# Patient Record
Sex: Female | Born: 1965 | Race: White | Hispanic: No | Marital: Married | Smoking: Current some day smoker
Health system: Southern US, Community
[De-identification: ages and names within clinical notes are randomized; demographics above are authoritative.]

## PROBLEM LIST (undated history)

## (undated) ENCOUNTER — Emergency Department: Payer: Self-pay

## (undated) DIAGNOSIS — T7840XA Allergy, unspecified, initial encounter: Secondary | ICD-10-CM

## (undated) HISTORY — DX: Allergy, unspecified, initial encounter: T78.40XA

---

## 2000-07-31 ENCOUNTER — Other Ambulatory Visit: Admission: RE | Admit: 2000-07-31 | Discharge: 2000-07-31 | Payer: Self-pay | Admitting: *Deleted

## 2004-06-23 ENCOUNTER — Other Ambulatory Visit: Admission: RE | Admit: 2004-06-23 | Discharge: 2004-06-23 | Payer: Self-pay | Admitting: Obstetrics and Gynecology

## 2005-05-05 ENCOUNTER — Other Ambulatory Visit: Admission: RE | Admit: 2005-05-05 | Discharge: 2005-05-05 | Payer: Self-pay | Admitting: Obstetrics and Gynecology

## 2006-02-21 ENCOUNTER — Encounter: Admission: RE | Admit: 2006-02-21 | Discharge: 2006-02-21 | Payer: Self-pay | Admitting: Obstetrics and Gynecology

## 2018-11-06 ENCOUNTER — Other Ambulatory Visit: Payer: Self-pay | Admitting: Obstetrics and Gynecology

## 2018-11-06 DIAGNOSIS — Z809 Family history of malignant neoplasm, unspecified: Secondary | ICD-10-CM

## 2018-11-26 ENCOUNTER — Other Ambulatory Visit: Payer: Self-pay | Admitting: Obstetrics and Gynecology

## 2018-11-27 ENCOUNTER — Ambulatory Visit
Admission: RE | Admit: 2018-11-27 | Discharge: 2018-11-27 | Disposition: A | Discharge status: Home / Self Care | Payer: Commercial Managed Care - PPO | Source: Ambulatory Visit | Attending: Obstetrics and Gynecology | Admitting: Obstetrics and Gynecology

## 2018-11-27 ENCOUNTER — Other Ambulatory Visit: Payer: Self-pay

## 2018-11-27 DIAGNOSIS — Z809 Family history of malignant neoplasm, unspecified: Secondary | ICD-10-CM

## 2018-11-27 MED ORDER — GADOBUTROL 1 MMOL/ML IV SOLN
10.0000 mL | Freq: Once | INTRAVENOUS | Status: AC | PRN
  Administered 2018-11-27: 10 mL via INTRAVENOUS

## 2018-12-14 ENCOUNTER — Other Ambulatory Visit: Payer: Self-pay

## 2018-12-14 DIAGNOSIS — Z20822 Contact with and (suspected) exposure to covid-19: Secondary | ICD-10-CM

## 2018-12-16 LAB — NOVEL CORONAVIRUS, NAA: SARS-CoV-2, NAA: NOT DETECTED

## 2020-09-05 IMAGING — MR MR BREAST BILAT WO/W CM
8 of 11 series · 33 of 48 positions shown · IV contrast (10 ml gadavist)
Comparison: Previous exam(s).

CLINICAL DATA: 53-year-old female with greater than 20% lifetime
risk of breast cancer.

LABS:  None performed today and site.
EXAM:
BILATERAL BREAST MRI WITH AND WITHOUT CONTRAST
TECHNIQUE: Multiplanar, multisequence MR images of both breasts were obtained
prior to and following the intravenous administration of 10 ml of
Gadavist.

[Series 2: t2_tirm_tra ipat (a-p) · axial · 3.0mm · 0.72mm/px · 1 of 60 slices shown]
[im 1/60]
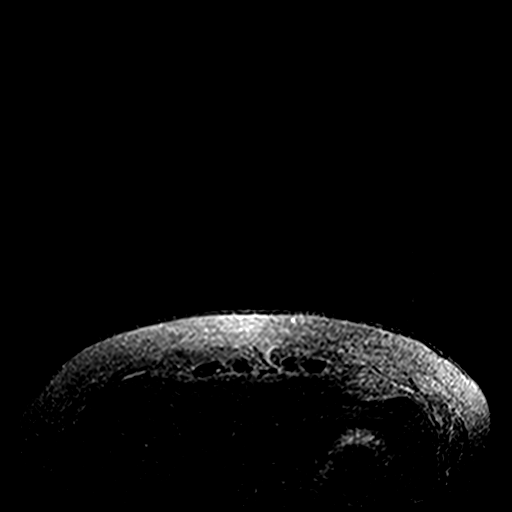

[Series 3: fl3d pre-cm no · axial · non-contrast · 1.2mm · 0.96mm/px · z∈[-79,+112]mm · 5 of 160 slices shown]
[im 1/160]
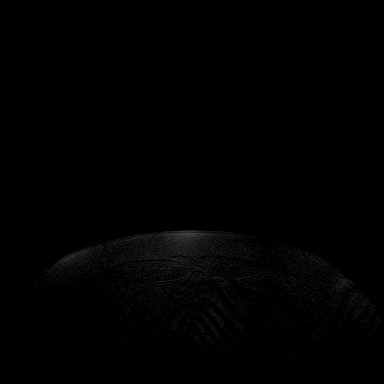
[im 40/160]
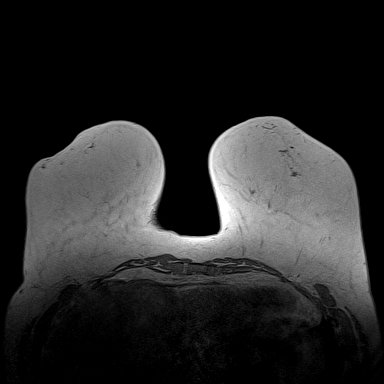
[im 80/160]
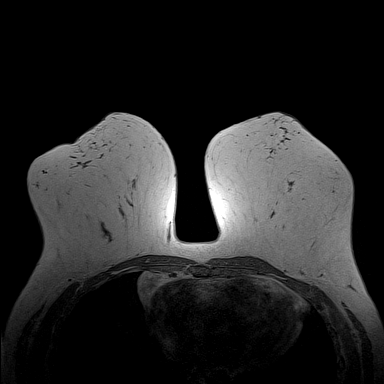
[im 120/160]
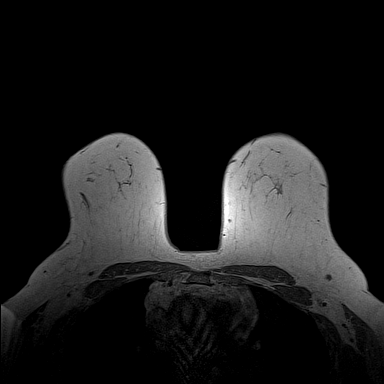
[im 160/160]
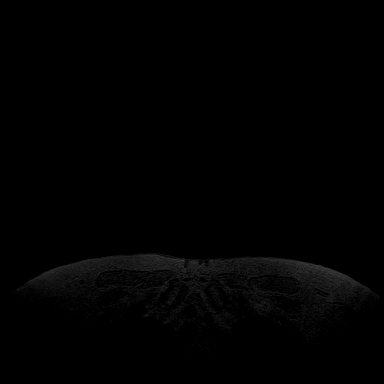

[Series 4: fl3d pre-cm · axial · non-contrast · 1.2mm · 0.96mm/px · z∈[-79,+112]mm · 5 of 160 slices shown]
[im 1/160]
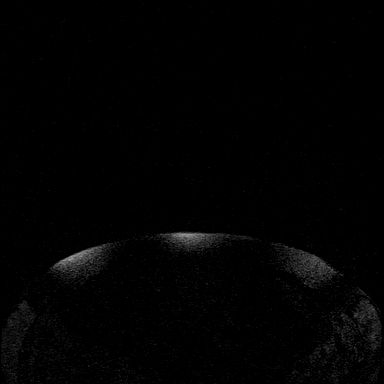
[im 40/160]
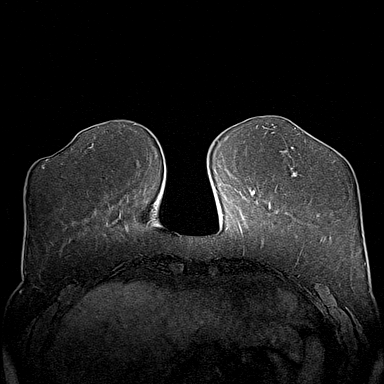
[im 80/160]
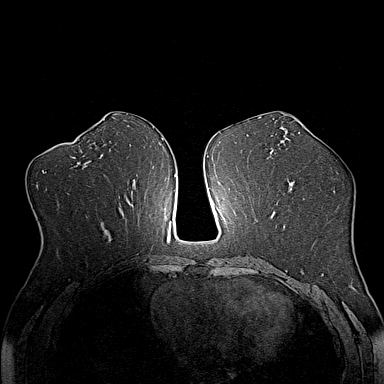
[im 120/160]
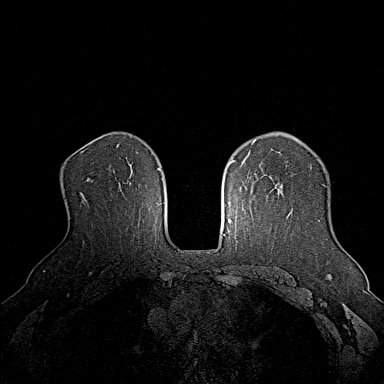
[im 160/160]
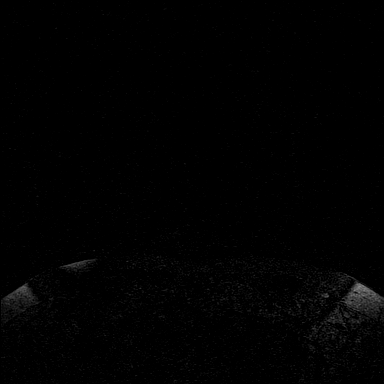

[Series 5: fl3d post-cm 20 · axial · 1.2mm · 0.96mm/px · z∈[-79,+112]mm · 5 of 160 slices shown (1 of 3)]
[im 1/160]
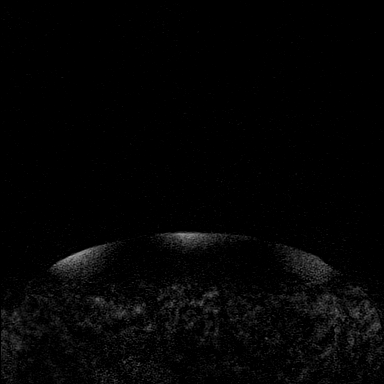
[im 40/160]
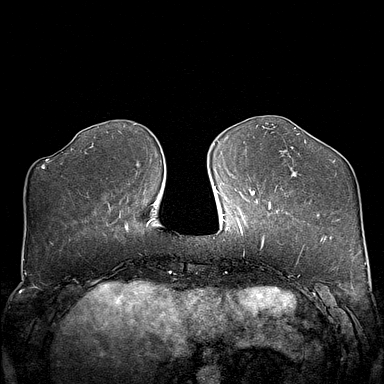
[im 80/160]
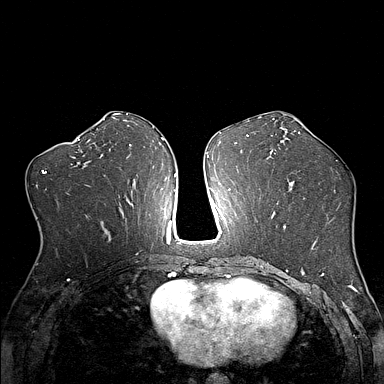
[im 120/160]
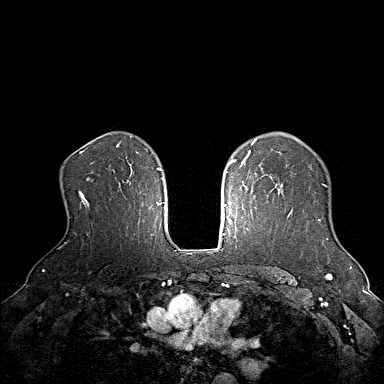
[im 160/160]
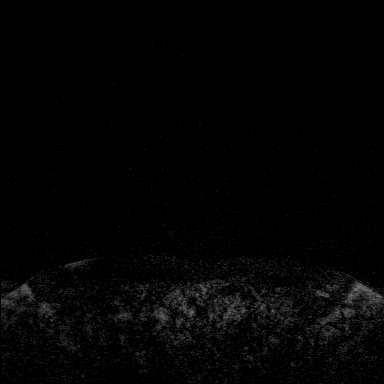

[Series 6: fl3d post-cm 20 · axial · 1.2mm · 0.96mm/px · z∈[-79,+112]mm · 6 of 160 slices shown (2 of 3)]
[im 1/160]
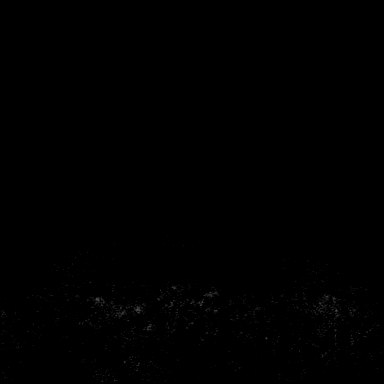
[im 32/160]
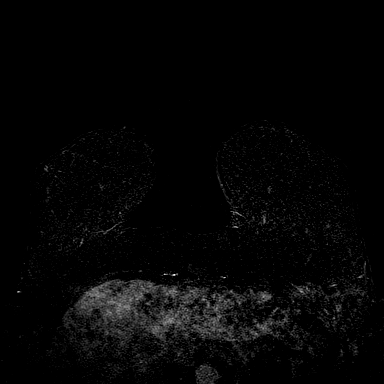
[im 64/160]
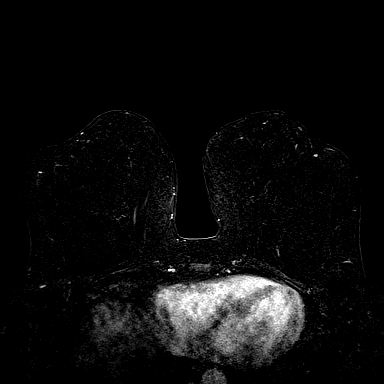
[im 96/160]
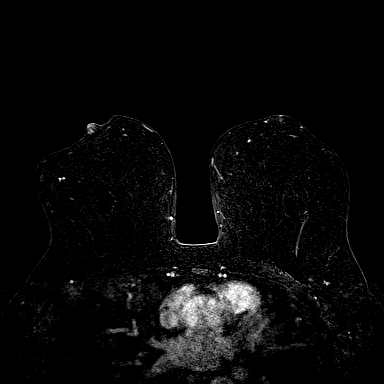
[im 128/160]
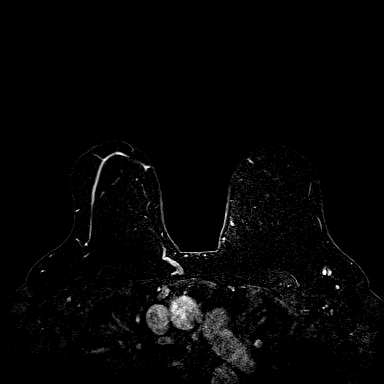
[im 160/160]
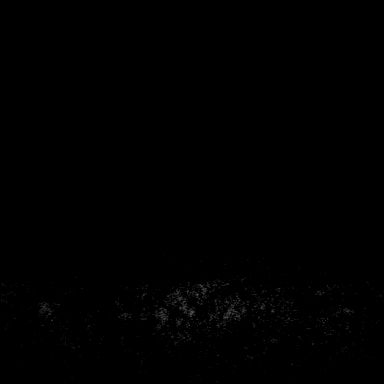

[Series 7: fl3d post-cm 20 · axial · 192.0mm · 0.96mm/px · 1 of 1 slices shown (3 of 3)]
[im 1/1]
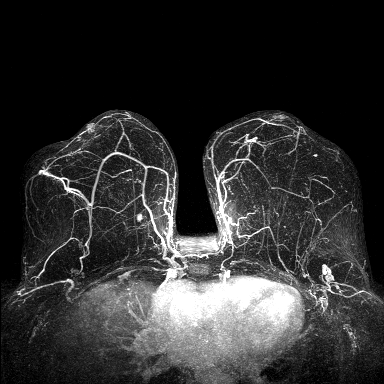

[Series 8: fl3d post-cm 3min · axial · 1.2mm · 0.96mm/px · z∈[-79,+112]mm · 6 of 160 slices shown]
[im 1/160]
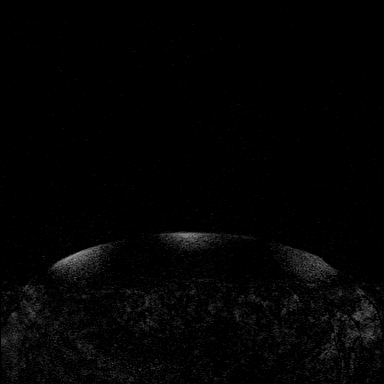
[im 32/160]
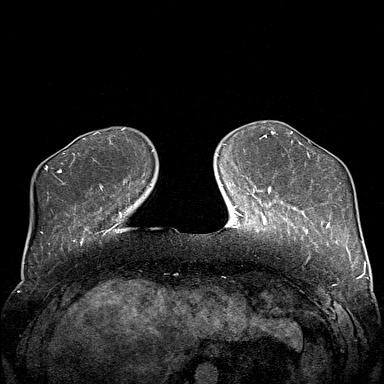
[im 64/160]
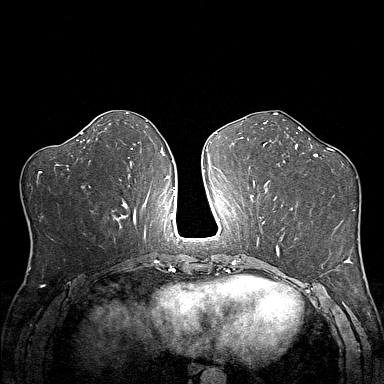
[im 96/160]
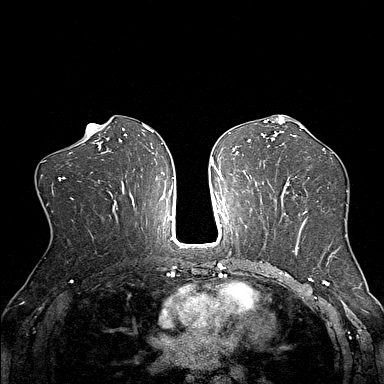
[im 128/160]
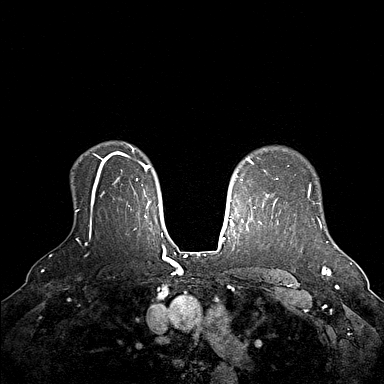
[im 160/160]
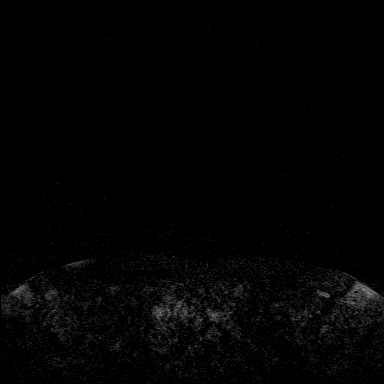

[Series 9: fl3d post-cm 3min_sub · axial · 1.2mm · 0.96mm/px · z∈[-79,+35]mm · 4 of 160 slices shown]
[im 1/160]
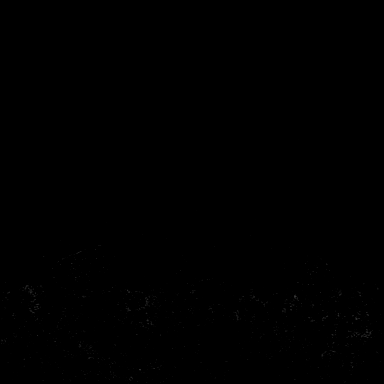
[im 32/160]
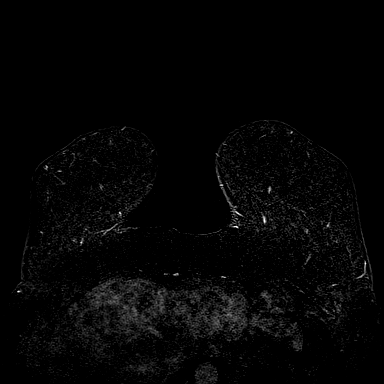
[im 64/160]
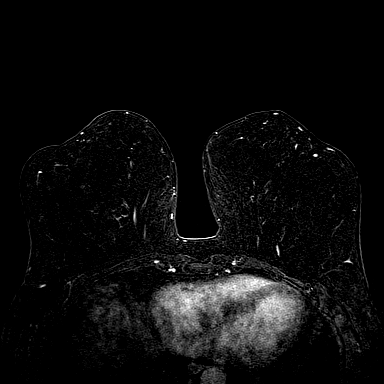
[im 96/160]
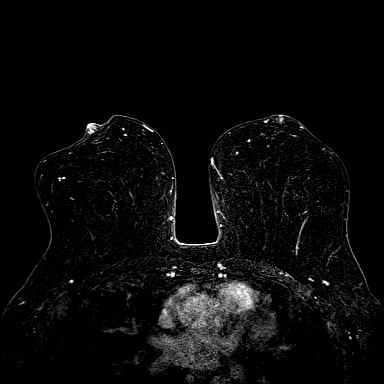

[33 of 48 positions shown; findings below may reference images not displayed]

Three-dimensional MR images were rendered by post-processing of the
original MR data on an independent workstation. The
three-dimensional MR images were interpreted, and findings are
reported in the following complete MRI report for this study. Three
dimensional images were evaluated at the independent [employer]
workstation
FINDINGS: Breast composition: b. Scattered fibroglandular tissue.

Background parenchymal enhancement: Mild.

Right breast: No suspicious mass or abnormal enhancement. A 7 mm
circumscribed mass in the far posterior upper inner aspect localizes
entirely within the skin and is consistent with a benign finding.

Left breast: No suspicious mass or abnormal enhancement.

Lymph nodes: No abnormal appearing lymph nodes.

Ancillary findings:  None.
IMPRESSION: No MRI evidence of malignancy in either breast.

RECOMMENDATION:
Annual bilateral screening with mammogram and MRI. Patient is due
for screening mammogram in August 2019.

BI-RADS CATEGORY  2: Benign.

## 2020-11-25 ENCOUNTER — Other Ambulatory Visit: Payer: Self-pay

## 2020-11-25 ENCOUNTER — Encounter: Payer: Self-pay | Admitting: Podiatry

## 2020-11-25 ENCOUNTER — Ambulatory Visit (INDEPENDENT_AMBULATORY_CARE_PROVIDER_SITE_OTHER): Payer: Commercial Managed Care - PPO | Admitting: Podiatry

## 2020-11-25 ENCOUNTER — Ambulatory Visit (INDEPENDENT_AMBULATORY_CARE_PROVIDER_SITE_OTHER): Payer: Commercial Managed Care - PPO

## 2020-11-25 DIAGNOSIS — M79672 Pain in left foot: Secondary | ICD-10-CM | POA: Diagnosis not present

## 2020-11-25 DIAGNOSIS — M79671 Pain in right foot: Secondary | ICD-10-CM

## 2020-11-25 DIAGNOSIS — M722 Plantar fascial fibromatosis: Secondary | ICD-10-CM

## 2020-11-25 DIAGNOSIS — L600 Ingrowing nail: Secondary | ICD-10-CM

## 2020-11-25 MED ORDER — TRIAMCINOLONE ACETONIDE 10 MG/ML IJ SUSP
10.0000 mg | Freq: Once | INTRAMUSCULAR | Status: AC
  Administered 2020-11-25: 10 mg

## 2020-11-25 NOTE — Patient Instructions (Signed)

## 2020-11-26 NOTE — Progress Notes (Signed)
Subjective:   Patient ID: Christy Santos, female   DOB: 55 y.o.   MRN: [id number]   HPI Patient presents stating she has been getting pain in her right heel and then her left big toe has been getting ingrown in the corner and she works on its been pretty good recently but it can become quite bothersome for her.  Patient states the heels worse when she gets up in the morning after periods of sitting and patient does not smoke likes to be active   Review of Systems  All other systems reviewed and are negative.      Objective:  Physical Exam Vitals and nursing note reviewed.  Constitutional:      Appearance: She is well-developed.  Pulmonary:     Effort: Pulmonary effort is normal.  Musculoskeletal:        General: Normal range of motion.  Skin:    General: Skin is warm.  Neurological:     Mental Status: She is alert.    Neurovascular status intact muscle strength adequate range of motion adequate with patient found to have inflammation pain of the plantar heel right at the insertional point of the tendon into the calcaneus with inflammation fluid around the medial band and pain when pressed.  On the left I noted incurvation of the medial border of the left hallux that is painful when pressed make shoe gear difficult     Assessment:  Acute plantar fasciitis right with inflammation fluid in the medial band at insertion along with ingrown toenail deformity left hallux medial border chronic in nature     Plan:  H&P reviewed both conditions.  At this point for the nail she will try soaks padding and understands ultimately correction will probably be necessary.  For the right I went ahead and I did sterile prep and injected the fascia 3 mg Kenalog 5 mg Xylocaine applied fascial brace to lift up the arch gave instructions for physical therapy support and reappoint to recheck  X-rays indicate there is moderate depression of the arch small spur no indication stress fracture right is

## 2022-02-02 ENCOUNTER — Encounter: Payer: Self-pay | Admitting: Gastroenterology

## 2022-02-25 ENCOUNTER — Ambulatory Visit (AMBULATORY_SURGERY_CENTER): Payer: Commercial Managed Care - PPO

## 2022-02-25 VITALS — Ht 67.0 in | Wt 205.0 lb

## 2022-02-25 DIAGNOSIS — Z1211 Encounter for screening for malignant neoplasm of colon: Secondary | ICD-10-CM

## 2022-02-25 MED ORDER — NA SULFATE-K SULFATE-MG SULF 17.5-3.13-1.6 GM/177ML PO SOLN: 1.0000 | Freq: Once | ORAL | 0 refills | Status: AC

## 2022-02-25 NOTE — Progress Notes (Signed)
No egg or soy allergy known to patient  No issues known to pt with past sedation with any surgeries or procedures Patient denies ever being told they had issues or difficulty with intubation  No FH of Malignant Hyperthermia Pt is not on diet pills Pt is not on  home 02  Pt is not on blood thinners  Pt denies issues with constipation  No A fib or A flutter Have any cardiac testing pending--no Pt instructed to use Single[employer]com or GoodRx for a price reduction on prep   

## 2022-03-14 ENCOUNTER — Encounter: Payer: Self-pay | Admitting: Gastroenterology

## 2022-03-23 ENCOUNTER — Encounter: Payer: Self-pay | Admitting: Certified Registered Nurse Anesthetist

## 2022-03-24 ENCOUNTER — Ambulatory Visit (AMBULATORY_SURGERY_CENTER): Payer: Commercial Managed Care - PPO | Admitting: Gastroenterology

## 2022-03-24 ENCOUNTER — Encounter: Payer: Self-pay | Admitting: Gastroenterology

## 2022-03-24 VITALS — BP 133/88 | HR 60 | Temp 97.8°F | Resp 15 | Ht 67.0 in | Wt 205.0 lb

## 2022-03-24 DIAGNOSIS — Z1211 Encounter for screening for malignant neoplasm of colon: Secondary | ICD-10-CM | POA: Diagnosis not present

## 2022-03-24 DIAGNOSIS — D120 Benign neoplasm of cecum: Secondary | ICD-10-CM

## 2022-03-24 DIAGNOSIS — D122 Benign neoplasm of ascending colon: Secondary | ICD-10-CM | POA: Diagnosis not present

## 2022-03-24 DIAGNOSIS — K635 Polyp of colon: Secondary | ICD-10-CM | POA: Diagnosis not present

## 2022-03-24 DIAGNOSIS — D123 Benign neoplasm of transverse colon: Secondary | ICD-10-CM | POA: Diagnosis not present

## 2022-03-24 MED ORDER — SODIUM CHLORIDE 0.9 % IV SOLN: 500.0000 mL | INTRAVENOUS | Status: DC

## 2022-03-24 NOTE — Progress Notes (Signed)
Pt's states no medical or surgical changes since previsit or office visit. 

## 2022-03-24 NOTE — Patient Instructions (Signed)
Please read handouts provided. Continue present medications.  Await pathology results. Repeat colonoscopy in 3 years for screening based on pathology.    YOU HAD AN ENDOSCOPIC PROCEDURE TODAY AT [facility]:   Refer to the procedure report that was given to you for any specific questions about what was found during the examination.  If the procedure report does not answer your questions, please call your gastroenterologist to clarify.  If you requested that your care partner not be given the details of your procedure findings, then the procedure report has been included in a sealed envelope for you to review at your convenience later.  YOU SHOULD EXPECT: Some feelings of bloating in the abdomen. Passage of more gas than usual.  Walking can help get rid of the air that was put into your GI tract during the procedure and reduce the bloating. If you had a lower endoscopy (such as a colonoscopy or flexible sigmoidoscopy) you may notice spotting of blood in your stool or on the toilet paper. If you underwent a bowel prep for your procedure, you may not have a normal bowel movement for a few days.  Please Note:  You might notice some irritation and congestion in your nose or some drainage.  This is from the oxygen used during your procedure.  There is no need for concern and it should clear up in a day or so.  SYMPTOMS TO REPORT IMMEDIATELY:  Following lower endoscopy (colonoscopy or flexible sigmoidoscopy):  Excessive amounts of blood in the stool  Significant tenderness or worsening of abdominal pains  Swelling of the abdomen that is new, acute  Fever of 100F or higher  For urgent or emergent issues, a gastroenterologist can be reached at any hour by calling [phone]. Do not use MyChart messaging for urgent concerns.    DIET:  We do recommend a small meal at first, but then you may proceed to your regular diet.  Drink plenty of fluids but you should avoid alcoholic  beverages for 24 hours.  ACTIVITY:  You should plan to take it easy for the rest of today and you should NOT DRIVE or use heavy machinery until tomorrow (because of the sedation medicines used during the test).    FOLLOW UP: Our staff will call the number listed on your records the next business day following your procedure.  We will call around 7:15- 8:00 am to check on you and address any questions or concerns that you may have regarding the information given to you following your procedure. If we do not reach you, we will leave a message.     If any biopsies were taken you will be contacted by phone or by letter within the next 1-3 weeks.  Please call us at [phone] if you have not heard about the biopsies in 3 weeks.    SIGNATURES/CONFIDENTIALITY: You and/or your care partner have signed paperwork which will be entered into your electronic medical record.  These signatures attest to the fact that that the information above on your After Visit Summary has been reviewed and is understood.  Full responsibility of the confidentiality of this discharge information lies with you and/or your care-partner.

## 2022-03-24 NOTE — Progress Notes (Signed)
Called to room to assist during endoscopic procedure.  Patient ID and intended procedure confirmed with present staff. Received instructions for my participation in the procedure from the performing physician.  

## 2022-03-24 NOTE — Op Note (Signed)
[facility] Patient Name: Oriane Wielgus Procedure Date: 03/24/2022 9:37 AM MRN: [id number] Endoscopist: Mauri Pole , MD, [id number] Age: 57 Referring MD:  Date of Birth: 1965-10-19 Gender: Female Account #: [id number] Procedure:                Colonoscopy Indications:              Screening for colorectal malignant neoplasm Medicines:                Monitored Anesthesia Care Procedure:                Pre-Anesthesia Assessment:                           - Prior to the procedure, a History and Physical                            was performed, and patient medications and                            allergies were reviewed. The patient's tolerance of                            previous anesthesia was also reviewed. The risks                            and benefits of the procedure and the sedation                            options and risks were discussed with the patient.                            All questions were answered, and informed consent                            was obtained. Prior Anticoagulants: The patient has                            taken no anticoagulant or antiplatelet agents. ASA                            Grade Assessment: II - A patient with mild systemic                            disease. After reviewing the risks and benefits,                            the patient was deemed in satisfactory condition to                            undergo the procedure.                           After obtaining informed consent, the colonoscope  was passed under direct vision. Throughout the                            procedure, the patient's blood pressure, pulse, and                            oxygen saturations were monitored continuously. The                            Olympus [id number]) Colonoscope was                            introduced through the anus and advanced to the the                            cecum,  identified by appendiceal orifice and                            ileocecal valve. The colonoscopy was performed                            without difficulty. The patient tolerated the                            procedure well. The quality of the bowel                            preparation was good. The ileocecal valve,                            appendiceal orifice, and rectum were photographed. Scope In: 9:51:19 AM Scope Out: 10:10:36 AM Scope Withdrawal Time: 0 hours 11 minutes 31 seconds  Total Procedure Duration: 0 hours 19 minutes 17 seconds  Findings:                 The perianal and digital rectal examinations were                            normal.                           Seven sessile polyps were found in the transverse                            colon, ascending colon and cecum. The polyps were 4                            to 14 mm in size. These polyps were removed with a                            cold snare. Resection and retrieval were complete.                           Scattered large-mouthed, medium-mouthed and  small-mouthed diverticula were found in the sigmoid                            colon, descending colon, transverse colon and                            ascending colon. Peri-diverticular erythema was                            seen. There was evidence of an impacted                            diverticulum.                           Non-bleeding external and internal hemorrhoids were                            found during retroflexion. The hemorrhoids were                            medium-sized. Complications:            No immediate complications. Estimated Blood Loss:     Estimated blood loss was minimal. Impression:               - Seven 4 to 14 mm polyps in the transverse colon,                            in the ascending colon and in the cecum, removed                            with a cold snare. Resected and retrieved.                            - Moderate diverticulosis in the sigmoid colon, in                            the descending colon, in the transverse colon and                            in the ascending colon. Peri-diverticular erythema                            was seen. There was evidence of an impacted                            diverticulum.                           - Non-bleeding external and internal hemorrhoids. Recommendation:           - Patient has a contact number available for                            emergencies. The signs and symptoms of  potential                            delayed complications were discussed with the                            patient. Return to normal activities tomorrow.                            Written discharge instructions were provided to the                            patient.                           - Resume previous diet.                           - Continue present medications.                           - Await pathology results.                           - Repeat colonoscopy in 3 years for surveillance                            based on pathology results. Mauri Pole, MD 03/24/2022 10:28:59 AM This report has been signed electronically.

## 2022-03-24 NOTE — Progress Notes (Signed)
[facility] History and Physical   Primary Care Physician:  Dian Queen, MD   Reason for Procedure:  Colorectal cancer screening  Plan:    Screening colonoscopy with possible interventions as needed     HPI: Christy Santos is a very pleasant 57 y.o. female here for screening colonoscopy. Denies any nausea, vomiting, abdominal pain, melena or bright red blood per rectum  The risks and benefits as well as alternatives of endoscopic procedure(s) have been discussed and reviewed. All questions answered. The patient agrees to proceed.    Past Medical History:  Diagnosis Date   Allergy     History reviewed. No pertinent surgical history.  Prior to Admission medications   Medication Sig Start Date End Date Taking? Authorizing Provider  cholecalciferol (VITAMIN D3) 25 MCG (1000 UNIT) tablet Take 1,000 Units by mouth daily.   Yes [provider]  co-enzyme Q-10 30 MG capsule Take 30 mg by mouth daily.   Yes [provider]  magnesium 30 MG tablet Take 30 mg by mouth daily.   Yes [provider]  Multiple Vitamin (MULTIVITAMIN) capsule Take 1 capsule by mouth daily.   Yes [provider]  albuterol (VENTOLIN HFA) 108 (90 Base) MCG/ACT inhaler albuterol sulfate HFA 90 mcg/actuation aerosol inhaler  INHALE 1 PUFF BY MOUTH ONCE A DAY AS NEEDED FOR ASTHMA Patient not taking: Reported on 02/25/2022    [provider]  Diethylpropion HCl CR 75 MG TB24 diethylpropion ER 75 mg tablet,extended release  TAKE 1 TABLET BY MOUTH EVERY DAY IN THE MORNING Patient not taking: Reported on 02/25/2022 06/12/17   [provider]  fluticasone (FLONASE) 50 MCG/ACT nasal spray Use 2 squirts to each nostril 2 times daily for 7-10 days, then 2 squirts in each nare daily Patient not taking: Reported on 02/25/2022 11/10/15   [provider]    Current Outpatient Medications  Medication Sig Dispense Refill   cholecalciferol (VITAMIN D3)  25 MCG (1000 UNIT) tablet Take 1,000 Units by mouth daily.     co-enzyme Q-10 30 MG capsule Take 30 mg by mouth daily.     magnesium 30 MG tablet Take 30 mg by mouth daily.     Multiple Vitamin (MULTIVITAMIN) capsule Take 1 capsule by mouth daily.     albuterol (VENTOLIN HFA) 108 (90 Base) MCG/ACT inhaler albuterol sulfate HFA 90 mcg/actuation aerosol inhaler  INHALE 1 PUFF BY MOUTH ONCE A DAY AS NEEDED FOR ASTHMA (Patient not taking: Reported on 02/25/2022)     Diethylpropion HCl CR 75 MG TB24 diethylpropion ER 75 mg tablet,extended release  TAKE 1 TABLET BY MOUTH EVERY DAY IN THE MORNING (Patient not taking: Reported on 02/25/2022)     fluticasone (FLONASE) 50 MCG/ACT nasal spray Use 2 squirts to each nostril 2 times daily for 7-10 days, then 2 squirts in each nare daily (Patient not taking: Reported on 02/25/2022)     Current Facility-Administered Medications  Medication Dose Route Frequency Provider Last Rate Last Admin   0.9 %  sodium chloride infusion  500 mL Intravenous Continuous Cyril Woodmansee, Venia Minks, MD        Allergies as of 03/24/2022 - Review Complete 03/24/2022  Allergen Reaction Noted   Aspirin Anaphylaxis 09/26/2014   Ibuprofen Anaphylaxis, Hives, and Swelling 04/25/1987   Other Shortness Of Breath 09/26/2014   Shellfish allergy  11/25/2020    Family History  Problem Relation Age of Onset   Colon cancer Neg Hx    Colon polyps Neg Hx  Esophageal cancer Neg Hx    Rectal cancer Neg Hx    Stomach cancer Neg Hx     Social History   Socioeconomic History   Marital status: Divorced    Spouse name: Not on file   Number of children: Not on file   Years of education: Not on file   Highest education level: Not on file  Occupational History   Not on file  Tobacco Use   Smoking status: Some Days    Packs/day: 0.50    Types: Cigarettes    Passive exposure: Never   Smokeless tobacco: Never  Substance and Sexual Activity   Alcohol use: Not Currently    Alcohol/week: 2.0  standard drinks of alcohol    Types: 2 Glasses of wine per week    Comment: 2 times a wek\ek   Drug use: Never   Sexual activity: Not on file  Other Topics Concern   Not on file  Social History Narrative   Not on file   Social Determinants of Health   Financial Resource Strain: Not on file  Food Insecurity: Not on file  Transportation Needs: Not on file  Physical Activity: Not on file  Stress: Not on file  Social Connections: Not on file  Intimate Partner Violence: Not on file    Review of Systems:  All other review of systems negative except as mentioned in the HPI.  Physical Exam: Vital signs in last 24 hours: Blood Pressure (Abnormal) 143/87   Pulse 68   Temperature 97.8 F (36.6 C)   Height '5\' 7"'$  (1.702 m)   Weight 205 lb (93 kg)   Oxygen Saturation 97%   Body Mass Index 32.11 kg/m  General:   Alert, NAD Lungs:  Clear .   Heart:  Regular rate and rhythm Abdomen:  Soft, nontender and nondistended. Neuro/Psych:  Alert and cooperative. Normal mood and affect. A and O x 3  Reviewed labs, radiology imaging, old records and pertinent past GI work up  Patient is appropriate for planned procedure(s) and anesthesia in an ambulatory setting   K. Denzil Magnuson , MD [phone]

## 2022-03-24 NOTE — Progress Notes (Signed)
Report given to PACU, vss 

## 2022-03-25 ENCOUNTER — Telehealth: Payer: Self-pay

## 2022-03-25 NOTE — Telephone Encounter (Signed)
Left message on follow up call. 

## 2022-04-07 ENCOUNTER — Encounter: Payer: Self-pay | Admitting: Gastroenterology
